# Patient Record
Sex: Male | Born: 1985 | Race: Black or African American | Hispanic: No | Marital: Single | Smoking: Former smoker
Health system: Southern US, Community
[De-identification: ages and names within clinical notes are randomized; demographics above are authoritative.]

---

## 2013-02-01 ENCOUNTER — Emergency Department (HOSPITAL_COMMUNITY)
Admission: EM | Admit: 2013-02-01 | Discharge: 2013-02-02 | Disposition: A | Discharge status: Home / Self Care | Payer: No Typology Code available for payment source | Attending: Emergency Medicine | Admitting: Emergency Medicine

## 2013-02-01 ENCOUNTER — Emergency Department (HOSPITAL_COMMUNITY): Payer: No Typology Code available for payment source

## 2013-02-01 DIAGNOSIS — M542 Cervicalgia: Secondary | ICD-10-CM

## 2013-02-01 DIAGNOSIS — S199XXA Unspecified injury of neck, initial encounter: Principal | ICD-10-CM

## 2013-02-01 DIAGNOSIS — Z7901 Long term (current) use of anticoagulants: Secondary | ICD-10-CM | POA: Insufficient documentation

## 2013-02-01 DIAGNOSIS — R109 Unspecified abdominal pain: Secondary | ICD-10-CM

## 2013-02-01 DIAGNOSIS — Z79899 Other long term (current) drug therapy: Secondary | ICD-10-CM | POA: Insufficient documentation

## 2013-02-01 DIAGNOSIS — Z862 Personal history of diseases of the blood and blood-forming organs and certain disorders involving the immune mechanism: Secondary | ICD-10-CM | POA: Insufficient documentation

## 2013-02-01 DIAGNOSIS — I10 Essential (primary) hypertension: Secondary | ICD-10-CM | POA: Insufficient documentation

## 2013-02-01 DIAGNOSIS — S0993XA Unspecified injury of face, initial encounter: Secondary | ICD-10-CM | POA: Insufficient documentation

## 2013-02-01 DIAGNOSIS — Z8639 Personal history of other endocrine, nutritional and metabolic disease: Secondary | ICD-10-CM | POA: Insufficient documentation

## 2013-02-01 DIAGNOSIS — IMO0002 Reserved for concepts with insufficient information to code with codable children: Secondary | ICD-10-CM | POA: Insufficient documentation

## 2013-02-01 DIAGNOSIS — Y92410 Unspecified street and highway as the place of occurrence of the external cause: Secondary | ICD-10-CM | POA: Insufficient documentation

## 2013-02-01 DIAGNOSIS — S3981XA Other specified injuries of abdomen, initial encounter: Secondary | ICD-10-CM | POA: Insufficient documentation

## 2013-02-01 DIAGNOSIS — Y9389 Activity, other specified: Secondary | ICD-10-CM | POA: Insufficient documentation

## 2013-02-01 LAB — CBC WITH DIFFERENTIAL/PLATELET
Basophils Absolute: 0 10*3/uL (ref 0.0–0.1)
Basophils Relative: 0 % (ref 0–1)
EOS PCT: 1 % (ref 0–5)
Eosinophils Absolute: 0.1 10*3/uL (ref 0.0–0.7)
HCT: 40.4 % (ref 39.0–52.0)
Hemoglobin: 14.5 g/dL (ref 13.0–17.0)
Lymphocytes Relative: 28 % (ref 12–46)
Lymphs Abs: 1.8 10*3/uL (ref 0.7–4.0)
MCH: 34 pg (ref 26.0–34.0)
MCHC: 35.9 g/dL (ref 30.0–36.0)
MCV: 94.8 fL (ref 78.0–100.0)
Monocytes Absolute: 0.8 10*3/uL (ref 0.1–1.0)
Monocytes Relative: 12 % (ref 3–12)
Neutro Abs: 3.8 10*3/uL (ref 1.7–7.7)
Neutrophils Relative %: 59 % (ref 43–77)
Platelets: 225 10*3/uL (ref 150–400)
RBC: 4.26 MIL/uL (ref 4.22–5.81)
RDW: 13 % (ref 11.5–15.5)
WBC: 6.5 10*3/uL (ref 4.0–10.5)

## 2013-02-01 LAB — COMPREHENSIVE METABOLIC PANEL
ALT: 154 U/L — ABNORMAL HIGH (ref 0–53)
AST: 91 U/L — AB (ref 0–37)
Albumin: 4.5 g/dL (ref 3.5–5.2)
Alkaline Phosphatase: 123 U/L — ABNORMAL HIGH (ref 39–117)
BUN: 10 mg/dL (ref 6–23)
CALCIUM: 9.9 mg/dL (ref 8.4–10.5)
CO2: 25 meq/L (ref 19–32)
Chloride: 102 mEq/L (ref 96–112)
Creatinine, Ser: 1 mg/dL (ref 0.50–1.35)
GFR calc Af Amer: >90 mL/min (ref >90)
Glucose, Bld: 106 mg/dL — ABNORMAL HIGH (ref 70–99)
POTASSIUM: 5 meq/L (ref 3.7–5.3)
Sodium: 142 mEq/L (ref 137–147)
TOTAL PROTEIN: 8.4 g/dL — AB (ref 6.0–8.3)
Total Bilirubin: 0.4 mg/dL (ref 0.3–1.2)

## 2013-02-01 LAB — PROTIME-INR
INR: 1.41 (ref 0.00–1.49)
Prothrombin Time: 16.9 seconds — ABNORMAL HIGH (ref 11.6–15.2)

## 2013-02-01 MED ORDER — MORPHINE SULFATE 4 MG/ML IJ SOLN
4.0000 mg | Freq: Once | INTRAMUSCULAR | Status: AC
  Administered 2013-02-02: 4 mg via INTRAVENOUS
  Filled 2013-02-01: qty 1

## 2013-02-01 MED ORDER — IOHEXOL 300 MG/ML  SOLN
100.0000 mL | Freq: Once | INTRAMUSCULAR | Status: AC | PRN
  Administered 2013-02-01: 100 mL via INTRAVENOUS

## 2013-02-01 MED ORDER — MORPHINE SULFATE 4 MG/ML IJ SOLN
4.0000 mg | Freq: Once | INTRAMUSCULAR | Status: AC
  Administered 2013-02-01: 4 mg via INTRAVENOUS
  Filled 2013-02-01: qty 1
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
## 2013-02-01 NOTE — ED Notes (Signed)
Pt reports pain in belly is not worsening; told to tell nurse first if it changes or worsens.
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
## 2013-02-01 NOTE — ED Notes (Signed)
Report to Michael RN
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
## 2013-02-01 NOTE — ED Notes (Signed)
Unable to find IV site.  Will call IV team

## 2013-02-02 ENCOUNTER — Encounter (HOSPITAL_COMMUNITY): Payer: Self-pay | Admitting: Emergency Medicine

## 2013-02-02 NOTE — Discharge Instructions (Signed)
Continue taking your home pain medications as directed. Follow-up with your primary care physician if problems occur. Return to the ED for new or worsening symptoms.

## 2013-02-03 NOTE — ED Provider Notes (Signed)
Medical screening examination/treatment/procedure(s) were performed by non-physician practitioner and as supervising physician I was immediately available for consultation/collaboration.  EKG Interpretation   None        Josanne Boerema L Shaletta Hinostroza, MD 02/03/13 1539 
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
